# Patient Record
Sex: Male | Born: 1955 | Race: Black or African American | Hispanic: No | Marital: Single | Smoking: Current every day smoker
Health system: Southern US, Community
[De-identification: ages and names within clinical notes are randomized; demographics above are authoritative.]

---

## 2002-09-27 ENCOUNTER — Encounter: Payer: Self-pay | Admitting: Emergency Medicine

## 2002-09-27 ENCOUNTER — Emergency Department (HOSPITAL_COMMUNITY): Admission: EM | Admit: 2002-09-27 | Discharge: 2002-09-27 | Payer: Self-pay | Admitting: Emergency Medicine

## 2002-10-25 ENCOUNTER — Emergency Department (HOSPITAL_COMMUNITY): Admission: EM | Admit: 2002-10-25 | Discharge: 2002-10-25 | Payer: Self-pay | Admitting: Emergency Medicine

## 2003-11-21 ENCOUNTER — Emergency Department (HOSPITAL_COMMUNITY): Admission: EM | Admit: 2003-11-21 | Discharge: 2003-11-21 | Payer: Self-pay | Admitting: Emergency Medicine

## 2005-06-03 IMAGING — CR DG CHEST 2V
2 series · 2 of 2 positions shown · non-contrast
Comparison: none

CLINICAL DATA: chest pain and shortness of breath; cough
 TWO VIEW CHEST
 There are no prior studies for comparison.  
 Low lung volumes are seen.  Streaky opacity is seen in both lung bases, which may be due to atelectasis or infiltrate.  
 There is no evidence of pleural effusion.  Heart size and mediastinal contours are normal.
 IMPRESSION
 Low lung volumes.  Mild bibasilar atelectasis versus infiltrates.

[view not recorded (1 of 2)]
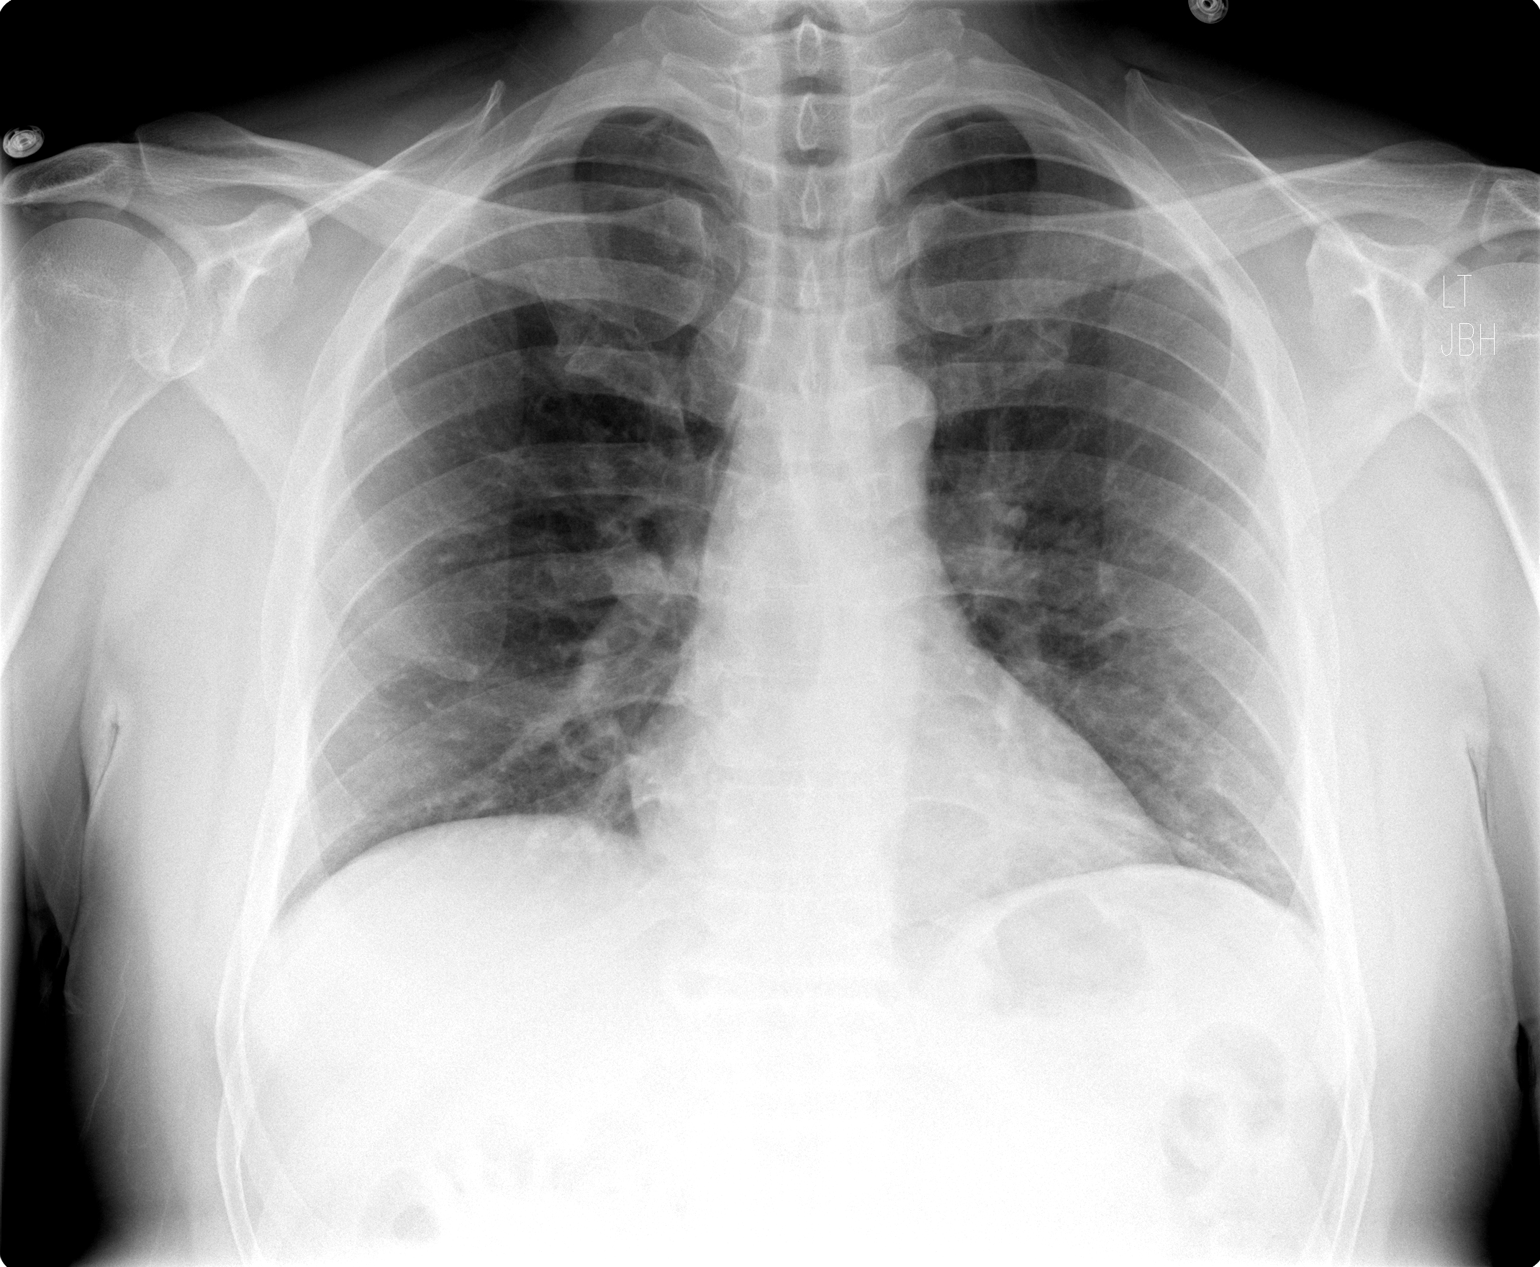

[view not recorded (2 of 2)]
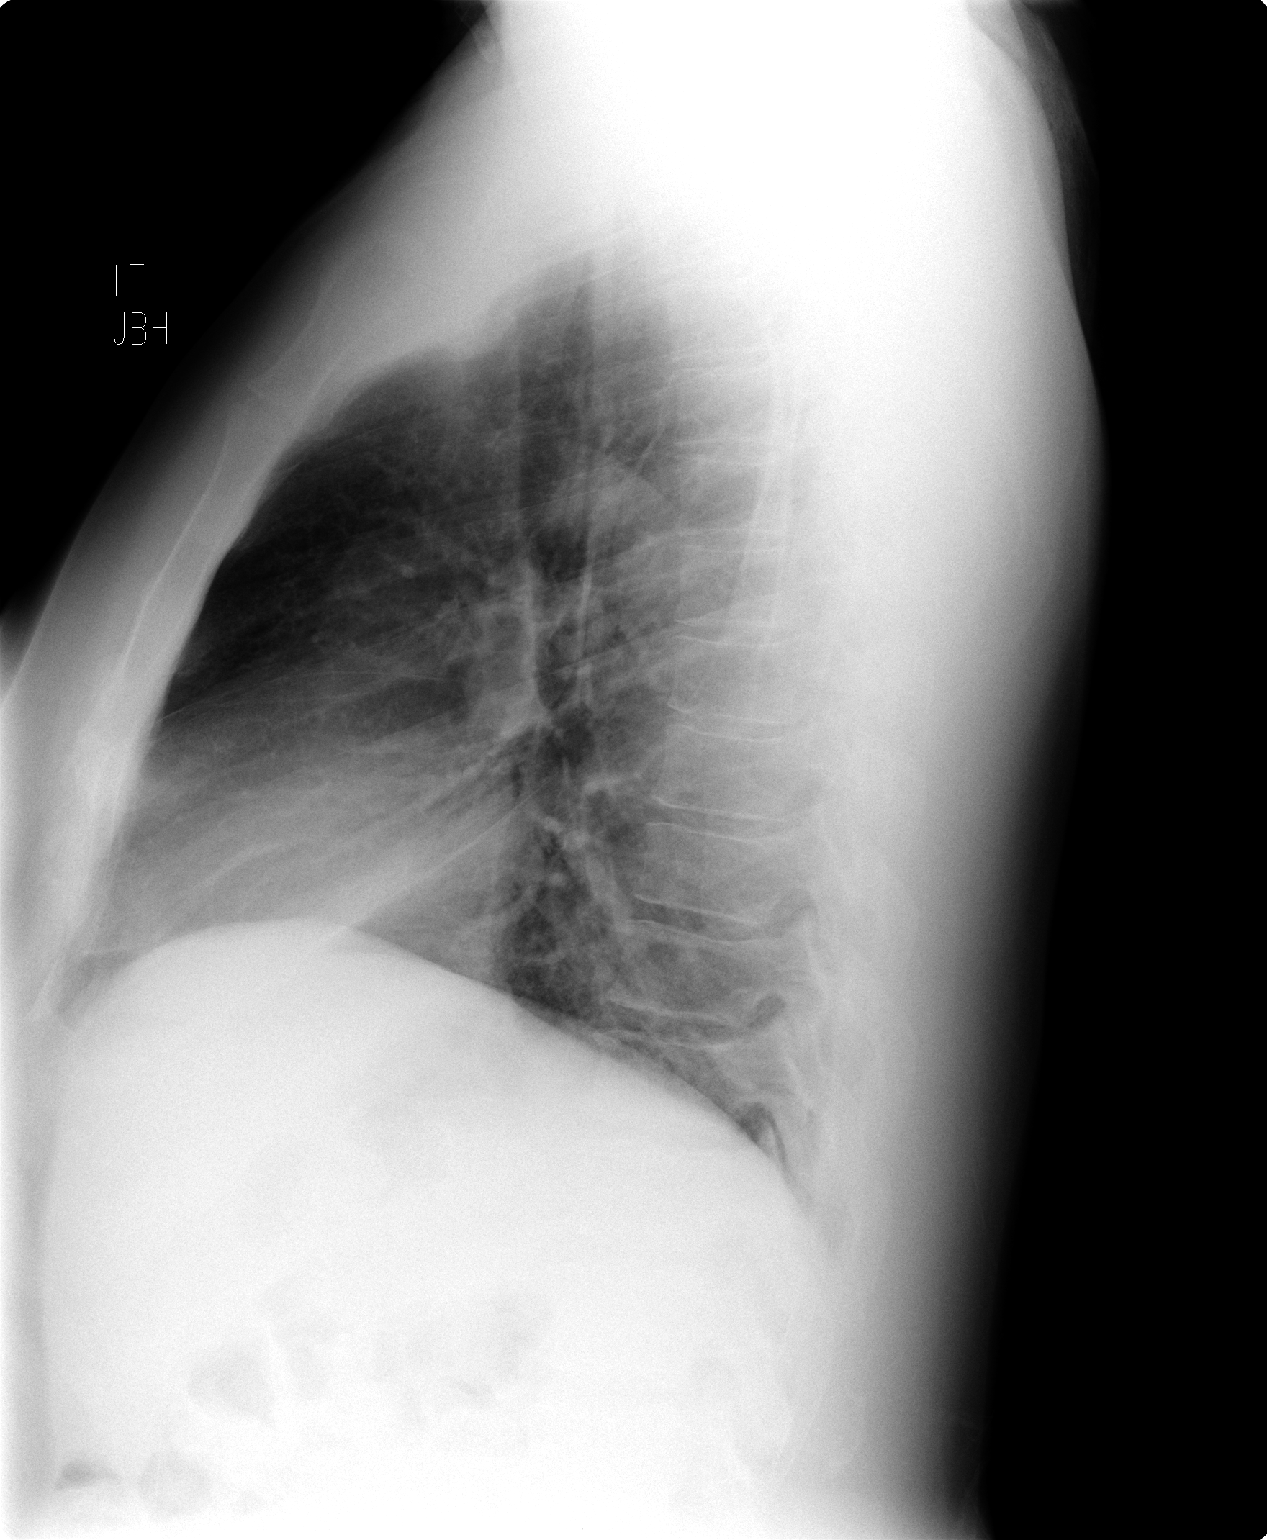

[2 of 2 positions shown; findings below may reference images not displayed]

## 2005-07-07 ENCOUNTER — Emergency Department (HOSPITAL_COMMUNITY): Admission: EM | Admit: 2005-07-07 | Discharge: 2005-07-07 | Payer: Self-pay | Admitting: Emergency Medicine

## 2005-11-05 ENCOUNTER — Emergency Department (HOSPITAL_COMMUNITY): Admission: EM | Admit: 2005-11-05 | Discharge: 2005-11-05 | Payer: Self-pay | Admitting: Emergency Medicine

## 2005-11-07 ENCOUNTER — Emergency Department (HOSPITAL_COMMUNITY): Admission: EM | Admit: 2005-11-07 | Discharge: 2005-11-07 | Payer: Self-pay | Admitting: Emergency Medicine

## 2011-05-11 ENCOUNTER — Telehealth: Payer: Self-pay | Admitting: *Deleted

## 2011-05-11 NOTE — Telephone Encounter (Signed)
Opened in error

## 2011-08-06 ENCOUNTER — Emergency Department (INDEPENDENT_AMBULATORY_CARE_PROVIDER_SITE_OTHER)
Admission: EM | Admit: 2011-08-06 | Discharge: 2011-08-06 | Disposition: A | Discharge status: Home / Self Care | Payer: Self-pay | Source: Home / Self Care | Attending: Emergency Medicine | Admitting: Emergency Medicine

## 2011-08-06 ENCOUNTER — Encounter (HOSPITAL_COMMUNITY): Payer: Self-pay

## 2011-08-06 DIAGNOSIS — N342 Other urethritis: Secondary | ICD-10-CM

## 2011-08-06 LAB — RPR: RPR Ser Ql: NONREACTIVE

## 2011-08-06 MED ORDER — CEFTRIAXONE SODIUM 250 MG IJ SOLR
250.0000 mg | Freq: Once | INTRAMUSCULAR | Status: AC
  Administered 2011-08-06: 250 mg via INTRAMUSCULAR

## 2011-08-06 MED ORDER — LIDOCAINE HCL (PF) 1 % IJ SOLN
INTRAMUSCULAR | Status: AC
  Filled 2011-08-06: qty 5

## 2011-08-06 MED ORDER — CEFTRIAXONE SODIUM 250 MG IJ SOLR
INTRAMUSCULAR | Status: AC
  Filled 2011-08-06: qty 250

## 2011-08-06 MED ORDER — AZITHROMYCIN 250 MG PO TABS
1000.0000 mg | ORAL_TABLET | Freq: Once | ORAL | Status: AC
  Administered 2011-08-06: 1000 mg via ORAL

## 2011-08-06 MED ORDER — AZITHROMYCIN 250 MG PO TABS
ORAL_TABLET | ORAL | Status: AC
  Filled 2011-08-06: qty 4

## 2011-08-06 NOTE — ED Notes (Signed)
Had unprotected sex 1 week ago, and has had pain w urination and drip for past 3 days; partner has informed him he needs treatment; NAD

## 2011-08-06 NOTE — ED Notes (Signed)
Please call pt at : [phone] for any lab isssues

## 2011-08-06 NOTE — ED Provider Notes (Signed)
Chief Complaint  Patient presents with  . SEXUALLY TRANSMITTED DISEASE    History of Present Illness:   Preston Pope is a 56 year old male who has had a four-day history of dysuria and discharge. He denies any lesions of the penis. He's not had any fever, chills, sore throat, adenopathy, joint pain, or skin rash. He denies any testicular pain or swelling. His last intercourse was about a week ago. He did not use a condom. He was informed by his partner that she has an STD, although he's not sure what it was.  Review of Systems:  Other than noted above, the patient denies any of the following symptoms: Systemic:  No fevers chills, aches, weight loss, arthralgias, myalgias, or adenopathy. GI:  No abdominal pain, nausea or vomiting. GU:  No dysuria, penile pain, discharge, itching, dysuria, genital lesions, testicular pain or swelling. Skin:  No rash or itching.  PMFSH:  Past medical history, family history, social history, meds, and allergies were reviewed.  Physical Exam:   Vital signs:  BP 133/61  Pulse 78  Temp(Src) 98.4 F (36.9 C) (Oral)  Resp 18  SpO2 99% Gen:  Alert, oriented, in no distress. Abdomen:  Soft and flat, non-distended, and non-tender.  No organomegaly or mass. Genital:  There is no urethral discharge, no lesions on the penis, these are normal with no tenderness, swelling, or mass. Skin:  Warm and dry.  No rash.   Labs:  No results found for this or any previous visit.   Medications given in [facility]:  He was given Rocephin 250 mg IM and azithromycin 1000 mg by mouth. He tolerated these well without any immediate side effects.  Assessment:   Diagnoses that have been ruled out:  None  Diagnoses that are still under consideration:  None  Final diagnoses:  Urethritis    Plan:   1.  The following meds were prescribed:   New Prescriptions   No medications on file   2.  The patient was instructed in symptomatic care and handouts were given. 3.  The patient was told  to return if becoming worse in any way, if no better in 3 or 4 days, and given some red flag symptoms that would indicate earlier return. 4.  The patient was instructed to inform all sexual contacts, avoid intercourse completely for 2 weeks and then only with a condom.  The patient was told that we would call about all abnormal lab results, and that we would need to report certain kinds of infection to the health department.    Roque Lias, MD 08/06/11 [phone]

## 2011-08-06 NOTE — Discharge Instructions (Signed)
You have been diagnosed with a possible STD.  Your results should be back in 3 days.  You can call us here at [phone] and ask for Suzanne.  She can tell you whether or not your results are back, but you must come here to get your results.  We do this to protect our patients' confidentiality.  You can come Monday through Friday and tell the receptionist that your are just here to get test results.  In the meantime, you should avoid intercourse altogether for 1 week.  After that, you should always use condoms--100% of the time.  This will not only prevent pregnancy, but has been shown to prevent HIV, syphilis, gonorrhea, chlamydia, hepatis C and other STDs.  If your test comes back positive, we are required by law to report it to the Health Department.  We also suggest you inform your partner or partners so they can get tested and treated as well.  

## 2011-08-07 LAB — GC/CHLAMYDIA PROBE AMP, GENITAL: GC Probe Amp, Genital: NEGATIVE

## 2020-04-23 ENCOUNTER — Ambulatory Visit: Payer: Self-pay | Attending: Internal Medicine

## 2020-04-23 DIAGNOSIS — Z23 Encounter for immunization: Secondary | ICD-10-CM

## 2020-04-23 NOTE — Progress Notes (Signed)
   [facility]  Name:  Preston Pope    MRN: [id number] DOB: 07/27/1955  04/23/2020  Mr. Preston Pope was observed post Covid-19 immunization for 15 minutes without incident. He was provided with Vaccine Information Sheet and instruction to access the V-Safe system.   Mr. Preston Pope was instructed to call 911 with any severe reactions post vaccine: Marland Kitchen Difficulty breathing  . Swelling of face and throat  . A fast heartbeat  . A bad rash all over body  . Dizziness and weakness   Immunizations Administered    No immunizations on file.
